# Patient Record
Sex: Male | Born: 1985 | Race: White | Hispanic: No | Marital: Married | Smoking: Never smoker
Health system: Southern US, Community
[De-identification: ages and names within clinical notes are randomized; demographics above are authoritative.]

## PROBLEM LIST (undated history)

## (undated) HISTORY — PX: FOOT FUSION: SHX956

---
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
## 2006-02-19 ENCOUNTER — Emergency Department (HOSPITAL_COMMUNITY): Admission: EM | Admit: 2006-02-19 | Discharge: 2006-02-19 | Payer: Self-pay | Admitting: Emergency Medicine

## 2006-04-23 ENCOUNTER — Emergency Department (HOSPITAL_COMMUNITY): Admission: EM | Admit: 2006-04-23 | Discharge: 2006-04-23 | Payer: Self-pay | Admitting: Emergency Medicine

## 2007-03-28 ENCOUNTER — Emergency Department (HOSPITAL_COMMUNITY): Admission: EM | Admit: 2007-03-28 | Discharge: 2007-03-28 | Payer: Self-pay | Admitting: Emergency Medicine

## 2007-04-17 ENCOUNTER — Emergency Department (HOSPITAL_COMMUNITY): Admission: EM | Admit: 2007-04-17 | Discharge: 2007-04-17 | Payer: Self-pay | Admitting: Emergency Medicine

## 2007-04-25 ENCOUNTER — Other Ambulatory Visit: Payer: Self-pay

## 2007-04-25 ENCOUNTER — Inpatient Hospital Stay (HOSPITAL_COMMUNITY): Admission: AD | Admit: 2007-04-25 | Discharge: 2007-04-27 | Payer: Self-pay | Admitting: Psychiatry

## 2007-04-25 ENCOUNTER — Ambulatory Visit: Payer: Self-pay | Admitting: Psychiatry

## 2007-09-24 ENCOUNTER — Emergency Department (HOSPITAL_COMMUNITY): Admission: EM | Admit: 2007-09-24 | Discharge: 2007-09-24 | Payer: Self-pay | Admitting: Emergency Medicine

## 2010-07-04 ENCOUNTER — Emergency Department (HOSPITAL_COMMUNITY)
Admission: EM | Admit: 2010-07-04 | Discharge: 2010-07-04 | Disposition: A | Discharge status: Home / Self Care | Payer: Self-pay | Attending: Emergency Medicine | Admitting: Emergency Medicine

## 2010-07-04 DIAGNOSIS — F3289 Other specified depressive episodes: Secondary | ICD-10-CM | POA: Insufficient documentation

## 2010-07-04 DIAGNOSIS — F329 Major depressive disorder, single episode, unspecified: Secondary | ICD-10-CM | POA: Insufficient documentation

## 2010-07-04 DIAGNOSIS — W268XXA Contact with other sharp object(s), not elsewhere classified, initial encounter: Secondary | ICD-10-CM | POA: Insufficient documentation

## 2010-07-04 DIAGNOSIS — G809 Cerebral palsy, unspecified: Secondary | ICD-10-CM | POA: Insufficient documentation

## 2010-07-04 DIAGNOSIS — F909 Attention-deficit hyperactivity disorder, unspecified type: Secondary | ICD-10-CM | POA: Insufficient documentation

## 2010-07-04 DIAGNOSIS — S61209A Unspecified open wound of unspecified finger without damage to nail, initial encounter: Secondary | ICD-10-CM | POA: Insufficient documentation

## 2010-07-04 DIAGNOSIS — Y92009 Unspecified place in unspecified non-institutional (private) residence as the place of occurrence of the external cause: Secondary | ICD-10-CM | POA: Insufficient documentation

## 2010-07-31 NOTE — H&P (Signed)
Peter Brewer, Peter Brewer                 ACCOUNT NO.:  [id number]   MEDICAL RECORD NO.:  [id number]          PATIENT TYPE:  IPS   LOCATION:  0507                          FACILITY:  [facility]   PHYSICIAN:  Geoffery Lyons, M.D.      DATE OF BIRTH:  03-26-1985   DATE OF ADMISSION:  04/26/2007  DATE OF DISCHARGE:                       PSYCHIATRIC ADMISSION ASSESSMENT   HISTORY:  This is a 25 year old single white male.  He is involuntarily  committed to the services of Dr. Madie Reno.  His father took out petition  papers on his son after his son's girlfriend called and reported  suicidal ideation.  The patient recently returned to this area.  He had  been in residential rehab in [place] up until approximately 3 weeks  ago.  He has relapsed.  He is suspected to be abusing Xanax.  He  apparently got into an argument with his mother with whom he was living.  She evicted him.  He started drinking.  Went to his girlfriend's place  of employment and said there was no reason for him to go on living, and  that she need not come to his funeral.  The girlfriend contacted the  patient's father who petitioned on the patient due to increasing  impulsivity and argumentativeness recently with family members.  The  patient's UA was positive for cocaine and benzodiazepines.  He is  prescribed Xanax 2 mg t.i.d. by Ellis Savage.  This was last prescribed  April 13, 2006 and [facility]   PAST PSYCHIATRIC HISTORY:  He has been with [place] once before, this was  back in September 2007.  At that time, he was having increased stress,  he was drinking beer.  There were no blackouts or seizures noted.  He  had been found passed out and was admitted at that time.   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  He needs to earn money.  He was living  with his mother, however, he was  evicted and he moved into a hotel.  He is separated from his girlfriend  and they did give up their infant son for adoption.  When he was last  here, they were pregnant.   FAMILY HISTORY:  Negative as far as we know.   ALCOHOL/DRUG HISTORY:  The patient's urine was positive for cocaine.  He  does acknowledge having done a little bump of cocaine.  He was just  recently in a treatment center in [place].  He was also positive for  benzodiazepines, but he is prescribed.  It is unclear whether he is in  fact abusing at this time are not.   PRIMARY CARE Gianmarco Roye:  He does not have one.  He is seeing being seen  at [facility] by Ellis Savage.   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  He was medically cleared in the ED at [facility].  As already  stated, his UA was positive for cocaine and benzodiazepines.  He did not  have any other remarkable physical findings.   PHYSICAL EXAMINATION:  VITAL SIGNS on admission to our unit showed that  his height is 68.75, weight 144, temperature is 97.4, blood pressure was  127/79, pulses 75-79 and respirations are 20.  MENTAL STATUS:  Today, he is alert.  He is appropriately groomed,  dressed and nourished.  His speech is a little hyperverbal.  His mood is  irritable and anxious.  His affect is congruent.  His thought processes  are not completely clear, rational.  They are goal oriented to take get  his Xanax restarted.  Judgment and insight are poor. Concentration and  memory are fair.  Intelligence is average-low average.  He denies being  suicidal or homicidal.  He denies auditory or visual hallucinations.   AXIS I:  Mood disorder, not otherwise specified, cocaine abuse,  potential Xanax abuse.  AXIS II:  Deferred.  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  AXIS IV:  Now homeless.  AXIS V:  30.   PLAN:  Admit for stabilization and safety.  We  will adjust his meds.  Toward that end, he was put on the low-dose Librium protocol.  [facility] [place]  He was prescribed Xanax 2  mg p.o. t.i.d.; tramadol 50 mg p.o. daily and Luvox CR prescription was  put on file, so it is unclear whether he in fact ever took this.  This  had been restarted prior to him being evaluated today and will remain  with those medications.  He will need help with placement once  discharged and estimated length of stay is 4-5 days.      Mickie Leonarda Salon, P.A.-C.      Geoffery Lyons, M.D.  Electronically Signed    MD/MEDQ  D:  04/26/2007  T:  04/27/2007  Job:  [id number]

## 2010-08-03 NOTE — Discharge Summary (Signed)
Peter Brewer, Peter Brewer                 ACCOUNT NO.:  [id number]   MEDICAL RECORD NO.:  [id number]          PATIENT TYPE:  IPS   LOCATION:  0405                          FACILITY:  [facility]   PHYSICIAN:  Peter Brewer, M.D.      DATE OF BIRTH:  02-08-1986   DATE OF ADMISSION:  12/02/2005  DATE OF DISCHARGE:  12/03/2005                                 DISCHARGE SUMMARY   CHIEF COMPLAINT AND PRESENT ILLNESS:  This was the first admission to [facility] for this 25 year old single white male voluntarily  admitted.  History of increased stress, needing a break, drinking beer,  increase to six-pack, last drink December 03, 2005.  No blackouts.  No  seizures.  Occasional use of marijuana.  Found passed out by his father.  Denied suicidal or homicidal ideation.  Currently in pharmacy school.  Baby  on the way.   PAST PSYCHIATRIC HISTORY:  First time at [facility].  Endorsed social  anxiety.  No current treatment.   ALCOHOL/DRUG HISTORY:  Increased use of alcohol.   MEDICAL PROBLEMS:  Cerebral palsy.   MEDICATIONS:  Xanax 3 mg XR.   PHYSICAL EXAMINATION:  Performed and failed to show any acute findings.   LABORATORY DATA:  Drug screen positive for benzodiazepines and marijuana.  Sodium 140, potassium 3.9, glucose 83.   MENTAL STATUS EXAM:  Fully alert, cooperative male.  Good eye contact.  Casually dressed.  Speech clear, normal rate, tempo and production.  Mood  anxiety.  Affect anxiety.  Thought processes logical, coherent and relevant.  No active delusions.  No active suicidal or homicidal ideation.  No  hallucinations.  Cognition was well-preserved.   ADMISSION DIAGNOSES:  AXIS I:  Alcohol and marijuana abuse.  AXIS II:  No diagnosis.  AXIS III:  Cerebral palsy.  AXIS IV:  Moderate.  AXIS V:  GAF upon admission 45; highest GAF in the last year 65.   HOSPITAL COURSE:  He was admitted.  He was started in individual and group  psychotherapy.  He did admit to the  increased use of alcohol, up to six  beers per day.  He also used Xanax XR 3 mg twice a day as prescribed by his  primary care physician.  Also admits to occasional use of marijuana.  He is  diagnosed ADHD and was earlier on Ritalin.  Endorsing anxiety, worse in  public settings.  Claimed he needed Xanax to function.  Has used SSRIs but  claims increased anxiety and restlessness when he used them.  He was not  committed to abstaining from Xanax but he claimed he was willing to quit  drinking.  Aware of the interaction between Xanax and alcohol.  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  Endorsed he wanted to get his life back together.  He needed to leave  the hospital as he had to be back in school.  Claimed he was making A's.  But staying in the hospital was going to  make things worse for him.  As he  was in full contact with reality, there was no evidence of suicidal or  homicidal ideation, no evidence of imminent withdrawal, we went ahead and  discharged to outpatient follow-up.   DISCHARGE DIAGNOSES:  AXIS I:  Alcohol and marijuana abuse.  Anxiety  disorder not otherwise specified.  AXIS II:  No diagnosis.  AXIS III:  Cerebral palsy.  AXIS IV:  Moderate.  AXIS V:  GAF upon discharge 50-55.   DISCHARGE MEDICATIONS:  Discharged on no medications.  He was probably going  to pursue the Xanax that he had refill for.  He was encouraged to discuss  with his physician how appropriate it was for ongoing use of Xanax.   FOLLOWUP:  Ardelle Lesches at [facility] in [place].      Peter Brewer, M.D.  Electronically Signed     IL/MEDQ  D:  01/16/2006  T:  01/17/2006  Job:  [id number]

## 2010-08-03 NOTE — Discharge Summary (Signed)
Peter Brewer, Peter Brewer                 ACCOUNT NO.:  [id number]   MEDICAL RECORD NO.:  [id number]          PATIENT TYPE:  IPS   LOCATION:  0507                          FACILITY:  [facility]   PHYSICIAN:  Geoffery Lyons, M.D.      DATE OF BIRTH:  1985/12/08   DATE OF ADMISSION:  04/25/2007  DATE OF DISCHARGE:  04/27/2007                               DISCHARGE SUMMARY   CHIEF COMPLAINT AND HISTORY OF PRESENT ILLNESS:  This was the second  admission to [facility]; the first one had been in  September 2007 for this 25 year old single white male involuntarily  committed.  Father took a petition after his son's girlfriend called and  reported suicidal thoughts.  The patient recently return to this area,  had been in residential rehab in [place] up to 3 weeks ago. He  relapsed. Suspected to be abusing Xanax. Apparently got into an argument  with his mother with whom he was living.  She evicted him. He started  drinking, went to his girlfriend place of employment and told her that  there was no reason for him to go on living, that she did not need to  come to his funeral.   PAST PSYCHIATRIC HISTORY:  He had been admitted February 2007. At that  time he was having increased stress, drinking beer. He was also using  marijuana.   ALCOHOL AND DRUG HISTORY:  As already stated, urine drug screen was  positive for cocaine and benzodiazepines. He claimed having done a  bump of cocaine recently and in treatment at [facility]. Positive for  benzodiazepine that he is prescribed.   PAST MEDICAL HISTORY:  He was born with cerebral palsy.  He had a stroke  at birth.   PHYSICAL EXAMINATION:  Failed to show any acute findings.   LABORATORY WORKUP:  Not available in the chart.   MENTAL STATUS EXAM:  Exam revealed alert, cooperative male appropriately  groomed and dressed, somewhat anxious, irritable.  Affect was congruent.  Wanted to get his Xanax restarted.  Clearer insight but denied any  active suicidal or homicidal ideations.  No evidence of delusions.  No  hallucinations.  Cognition well preserved.   AXIS I:  1. Cocaine abuse, rule out benzodiazepine abuse.  2 . Mood disorder not otherwise specified.  AXIS II:  No diagnosis.  AXIS III.  Cerebral palsy.  AXIS IV:  Moderate.  AXIS V:  Upon admission 30-35, GAF in the last year 70.   COURSE IN THE HOSPITAL:  Was admitted.  We started detoxification with  Librium. As already stated, a 25 year old male. After arguing with his  mother who evicted him. he was petitioned. Family session on February 8  with his father. His perception was that his father was being mean. He  said he was never suicidal. His father was hoping that being in the unit  was going to help him decide to go to a 30-day program. He did not want  to that, wanted to be released. He was due to start new job Monday at 11  o'clock. The  father felt that he could be released and that he did not  seem to be ready to face his problem.  He continued to minimize his  drinking or drug use, claimed that he drank three times in the past 3  months.  February 9, he endorsed that he was good to go.  Endorsed no  active suicidal or homicidal ideas, said that the girlfriend told things  that were not accurate, said that he would never hurt himself. Claimed  that, if anything, things are better for him right now as compared to  when he was admitted in 2007.  He was looking forward to a new job. e  was connected with outpatient services, was looking forward to his life  at this particular time.  Endorsed he was committed to abstaining.   DISCHARGE DIAGNOSES:  AXIS I:  1 . Cocaine and benzodiazepine abuse.  2 . Anxiety disorder not otherwise specified.  AXIS II:  No diagnosis.  AXIS III:  Cerebral palsy.  AXIS IV:  Moderate.  AXIS V:  Upon discharge 50.   DISCHARGE MEDICATIONS:  1. Demerol 50 mg every 6 hours as needed,  2. Luvox 100 mg per day.  3. Librium 25 one  after lunch and at bedtime, then one on February 9,      then one twice a day February 10 and once in the morning February      11, then discontinue.  4. Asked to stop the Xanax.   FOLLOWUP PLAN:  Psychiatric with [facility].      Geoffery Lyons, M.D.  Electronically Signed     IL/MEDQ  D:  05/22/2007  T:  05/24/2007  Job:  [id number]

## 2010-12-07 LAB — DIFFERENTIAL
Eosinophils Relative: 2
Lymphocytes Relative: 44
Lymphs Abs: 3.5
Monocytes Absolute: 1.2 — ABNORMAL HIGH
Monocytes Relative: 15 — ABNORMAL HIGH
Neutro Abs: 3.1
Neutrophils Relative %: 38 — ABNORMAL LOW

## 2010-12-07 LAB — BASIC METABOLIC PANEL
BUN: 10
CO2: 30
Chloride: 101
Creatinine, Ser: 0.96
GFR calc non Af Amer: >60
Glucose, Bld: 84
Sodium: 139

## 2010-12-07 LAB — RAPID URINE DRUG SCREEN, HOSP PERFORMED
Amphetamines: NOT DETECTED
Barbiturates: NOT DETECTED
Tetrahydrocannabinol: NOT DETECTED

## 2010-12-07 LAB — HEPATIC FUNCTION PANEL: Bilirubin, Direct: 0.2

## 2010-12-07 LAB — CBC
Hemoglobin: 16.5
MCHC: 34.2
RBC: 5.44
WBC: 8.1

## 2010-12-07 LAB — ETHANOL: Alcohol, Ethyl (B): <5

## 2021-02-24 ENCOUNTER — Emergency Department (HOSPITAL_COMMUNITY)
Admission: EM | Admit: 2021-02-24 | Discharge: 2021-02-24 | Discharge status: Against Medical Advice | Payer: Self-pay | Source: Home / Self Care

## 2021-04-23 ENCOUNTER — Encounter (HOSPITAL_BASED_OUTPATIENT_CLINIC_OR_DEPARTMENT_OTHER): Payer: Self-pay

## 2021-04-23 ENCOUNTER — Other Ambulatory Visit: Payer: Self-pay

## 2021-04-23 ENCOUNTER — Emergency Department (HOSPITAL_BASED_OUTPATIENT_CLINIC_OR_DEPARTMENT_OTHER)
Admission: EM | Admit: 2021-04-23 | Discharge: 2021-04-23 | Disposition: A | Discharge status: Home / Self Care | Payer: Self-pay | Attending: Emergency Medicine | Admitting: Emergency Medicine

## 2021-04-23 ENCOUNTER — Emergency Department (HOSPITAL_BASED_OUTPATIENT_CLINIC_OR_DEPARTMENT_OTHER): Payer: Self-pay

## 2021-04-23 DIAGNOSIS — W182XXA Fall in (into) shower or empty bathtub, initial encounter: Secondary | ICD-10-CM | POA: Insufficient documentation

## 2021-04-23 DIAGNOSIS — R55 Syncope and collapse: Secondary | ICD-10-CM | POA: Insufficient documentation

## 2021-04-23 DIAGNOSIS — S0003XA Contusion of scalp, initial encounter: Secondary | ICD-10-CM | POA: Insufficient documentation

## 2021-04-23 LAB — RAPID URINE DRUG SCREEN, HOSP PERFORMED
Amphetamines: NOT DETECTED
Barbiturates: NOT DETECTED
Benzodiazepines: NOT DETECTED
Cocaine: NOT DETECTED
Opiates: NOT DETECTED
Tetrahydrocannabinol: POSITIVE — AB

## 2021-04-23 LAB — CBC
HCT: 48.5 % (ref 39.0–52.0)
Hemoglobin: 16.2 g/dL (ref 13.0–17.0)
MCH: 29.8 pg (ref 26.0–34.0)
MCHC: 33.4 g/dL (ref 30.0–36.0)
MCV: 89.2 fL (ref 80.0–100.0)
Platelets: 268 10*3/uL (ref 150–400)
RBC: 5.44 MIL/uL (ref 4.22–5.81)
RDW: 14.7 % (ref 11.5–15.5)
WBC: 9.8 10*3/uL (ref 4.0–10.5)
nRBC: 0 % (ref 0.0–0.2)

## 2021-04-23 LAB — COMPREHENSIVE METABOLIC PANEL
ALT: 17 U/L (ref 0–44)
AST: 18 U/L (ref 15–41)
Albumin: 4.5 g/dL (ref 3.5–5.0)
Alkaline Phosphatase: 48 U/L (ref 38–126)
Anion gap: 9 (ref 5–15)
BUN: 14 mg/dL (ref 6–20)
CO2: 25 mmol/L (ref 22–32)
Calcium: 9.2 mg/dL (ref 8.9–10.3)
Chloride: 102 mmol/L (ref 98–111)
Creatinine, Ser: 0.82 mg/dL (ref 0.61–1.24)
GFR, Estimated: >60 mL/min (ref >60)
Glucose, Bld: 141 mg/dL — ABNORMAL HIGH (ref 70–99)
Potassium: 3.9 mmol/L (ref 3.5–5.1)
Sodium: 136 mmol/L (ref 135–145)
Total Bilirubin: 0.6 mg/dL (ref 0.3–1.2)
Total Protein: 7 g/dL (ref 6.5–8.1)

## 2021-04-23 LAB — ETHANOL: Alcohol, Ethyl (B): <10 mg/dL (ref <10)

## 2021-04-23 LAB — TROPONIN I (HIGH SENSITIVITY)
Troponin I (High Sensitivity): 3 ng/L (ref <18)
Troponin I (High Sensitivity): 3 ng/L (ref <18)

## 2021-04-23 MED ORDER — SODIUM CHLORIDE 0.9 % IV BOLUS
1000.0000 mL | Freq: Once | INTRAVENOUS | Status: AC
  Administered 2021-04-23: 1000 mL via INTRAVENOUS

## 2021-04-23 NOTE — ED Notes (Signed)
Patient transported to CT 

## 2021-04-23 NOTE — ED Notes (Signed)
Patient verbalizes understanding of discharge instructions. Opportunity for questioning and answers were provided. Patient discharged from ED.  °

## 2021-04-23 NOTE — ED Provider Notes (Signed)
[facility]   CSN: [id number] Arrival date & time: 04/23/21  [id number]     History  Chief Complaint  Patient presents with   Loss of Consciousness    Peter HartshornJacob Brewer is a 36 y.o. male.  HPI 36 year old male presents today after syncopal episode.  He states that he was up getting ready to go to the gym in his usual state of health.  He took his weekly testosterone shot.  He was urinating when he became lightheaded and he woke up backwards in the bathtub after striking the side of his head.  He thinks the timeframe was approximately 5 minutes.  His girlfriend had already gone to work and there was no one else awake in the home.  He has a contused area that he is noted on the right side of his head.  He has not had any intraoral trauma.  He did not note any chest pain, shortness of breath, abdominal pain, nausea, or vomiting.  He felt sweaty just prior to the episode.  He denies any prior similar episodes.  He states that he is taking the testosterone from an online source due to cost.  He is does not feel that there is any change in this from prior.  He has a distant history of seizure related to alcohol withdrawal but states he has not been drinking for quite a long time.     Home Medications Prior to Admission medications   Not on File      Allergies    Patient has no allergy information on record.    Review of Systems   Review of Systems  All other systems reviewed and are negative.  Physical Exam Updated Vital Signs BP 116/66    Pulse (!) 52    Temp 98.9 F (37.2 C) (Oral)    Resp 15    Ht 1.753 m (5\' 9" )    Wt 70.3 kg    SpO2 100%    BMI 22.89 kg/m  Physical Exam Vitals and nursing note reviewed.  Constitutional:      Appearance: Normal appearance.  HENT:     Head: Normocephalic and atraumatic.     Right Ear: External ear normal.     Left Ear: External ear normal.     Nose: Nose normal.     Mouth/Throat:     Mouth: Mucous membranes  are moist.     Pharynx: Oropharynx is clear.  Eyes:     Pupils: Pupils are equal, round, and reactive to light.  Cardiovascular:     Rate and Rhythm: Normal rate.  Pulmonary:     Effort: Pulmonary effort is normal.  Abdominal:     General: Abdomen is flat. Bowel sounds are normal.     Palpations: Abdomen is soft.  Musculoskeletal:        General: Normal range of motion.     Cervical back: Normal range of motion.  Skin:    General: Skin is warm and dry.  Neurological:     General: No focal deficit present.     Mental Status: He is alert and oriented to person, place, and time.     Cranial Nerves: No cranial nerve deficit.     Sensory: No sensory deficit.     Motor: No weakness.     Coordination: Coordination normal.  Psychiatric:        Mood and Affect: Mood normal.        Behavior: Behavior normal.  ED Results / Procedures / Treatments   Labs (all labs ordered are listed, but only abnormal results are displayed) Labs Reviewed  COMPREHENSIVE METABOLIC PANEL - Abnormal; Notable for the following components:      Result Value   Glucose, Bld 141 (*)    All other components within normal limits  RAPID URINE DRUG SCREEN, HOSP PERFORMED - Abnormal; Notable for the following components:   Tetrahydrocannabinol POSITIVE (*)    All other components within normal limits  CBC  ETHANOL  TROPONIN I (HIGH SENSITIVITY)  TROPONIN I (HIGH SENSITIVITY)    EKG EKG Interpretation  Date/Time:  Monday April 23 2021 07:29:42 EST Ventricular Rate:  67 PR Interval:  128 QRS Duration: 94 QT Interval:  364 QTC Calculation: 385 R Axis:   84 Text Interpretation: Sinus rhythm Normal ECG No significant change since last tracing Confirmed by Peter Brewer [phone]) on 04/23/2021 8:34:40 AM  Radiology CT Head Wo Contrast  Result Date: 04/23/2021 CLINICAL DATA:  36 year old male with history of head trauma after falling into a bathtub. EXAM: CT HEAD WITHOUT CONTRAST CT CERVICAL SPINE WITHOUT  CONTRAST TECHNIQUE: Multidetector CT imaging of the head and cervical spine was performed following the standard protocol without intravenous contrast. Multiplanar CT image reconstructions of the cervical spine were also generated. RADIATION DOSE REDUCTION: This exam was performed according to the departmental dose-optimization program which includes automated exposure control, adjustment of the mA and/or kV according to patient size and/or use of iterative reconstruction technique. COMPARISON:  Head CT 07/29/2003. FINDINGS: CT HEAD FINDINGS Brain: There is a well-defined area of low attenuation in the inferior aspect of the left frontal lobe anteriorly, the appearance of which is most compatible with encephalomalacia, likely related to remote contusion. Mild contour abnormality of the left lateral ventricle, stable compared to prior study from 2005, likely congenital (potentially related to mild periventricular leukomalacia). No evidence of acute infarction, hemorrhage, hydrocephalus, extra-axial collection or mass lesion/mass effect. Vascular: No hyperdense vessel or unexpected calcification. Skull: Normal. Negative for fracture or focal lesion. Sinuses/Orbits: No acute finding. Other: None. CT CERVICAL SPINE FINDINGS Alignment: Normal. Skull base and vertebrae: No acute fracture. No primary bone lesion or focal pathologic process. Soft tissues and spinal canal: No prevertebral fluid or swelling. No visible canal hematoma. Disc levels: No significant degenerative disc disease or facet arthropathy. Upper chest: Negative. Other: None. IMPRESSION: 1. No evidence of significant acute traumatic injury to the skull, brain or cervical spine. 2. 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 Electronically Signed   By: Peter Brewer M.D.   On: 04/23/2021 08:10   CT Cervical Spine Wo Contrast  Result  Date: 04/23/2021 CLINICAL DATA:  36 year old male with history of head trauma after falling into a bathtub. EXAM: CT HEAD WITHOUT CONTRAST CT CERVICAL SPINE WITHOUT CONTRAST TECHNIQUE: Multidetector CT imaging of the head and cervical spine was performed following the standard protocol without intravenous contrast. Multiplanar CT image reconstructions of the cervical spine were also generated. RADIATION DOSE REDUCTION: This exam was performed according to the departmental dose-optimization program which includes automated exposure control, adjustment of the mA and/or kV according to patient size and/or use of iterative reconstruction technique. COMPARISON:  Head CT 07/29/2003. FINDINGS: CT HEAD FINDINGS Brain: There is a well-defined area of low attenuation in the inferior aspect of the left frontal lobe anteriorly, the appearance of which is most compatible with encephalomalacia, likely  related to remote contusion. Mild contour abnormality of the left lateral ventricle, stable compared to prior study from 2005, likely congenital (potentially related to mild periventricular leukomalacia). No evidence of acute infarction, hemorrhage, hydrocephalus, extra-axial collection or mass lesion/mass effect. Vascular: No hyperdense vessel or unexpected calcification. Skull: Normal. Negative for fracture or focal lesion. Sinuses/Orbits: No acute finding. Other: None. CT CERVICAL SPINE FINDINGS Alignment: Normal. Skull base and vertebrae: No acute fracture. No primary bone lesion or focal pathologic process. Soft tissues and spinal canal: No prevertebral fluid or swelling. No visible canal hematoma. Disc levels: No significant degenerative disc disease or facet arthropathy. Upper chest: Negative. Other: None. IMPRESSION: 1. No evidence of significant acute traumatic injury to the skull, brain or cervical spine. 2. 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 Electronically Signed   By: Peter Brewer M.D.   On: 04/23/2021 08:10    Procedures Procedures    Medications Ordered in ED Medications  sodium chloride 0.9 % bolus 1,000 mL (0 mLs Intravenous Stopped 04/23/21 0930)    ED Course/ Medical Decision Making/ A&P Clinical Course as of 04/23/21 1014  Mon Apr 23, 2021  0844 CBC personally reviewed and interpreted as normal Chemistry personally reviewed and mild hyperglycemia with glucose of 141 interpreted otherwise within normal limits Alcohol reviewed and is within normal limits Urine drug screen is positive only for THC Alcohol is less than 10 First troponin is 3.  Repeat troponin is pending. [DR]  1012 Repeat troponin reviewed and is 3 which is unchanged from prior with 0 delta Peter Brewer [DR]    Clinical Course User Index [DR] Peter Grizzle, MD                           Medical Decision Making 36 yo male with presumed syncopal episode.  No witness but per patient time frame approximately 5 minutes.  Head contusion which required ct head and neck due to loc of unknown duration. No acute changes EKG without acute changes specifically no evidence of stemi- first troponin normal.  Plan delta troponin. OW labs essentiall winthin normal limits Patient did take exogenous testosterone of unknown etiology- cannot rule out contaminant UDS pending   Amount and/or Complexity of Data Reviewed Labs: ordered. Decision-making details documented in ED Course. Radiology: ordered and independent interpretation performed. Decision-making details documented in ED Course. ECG/medicine tests: ordered and independent interpretation performed. Decision-making details documented in ED Course. Discussion of management or test interpretation with external provider(s): Patient has remained on monitor in normal sinus rhythm  Risk Decision regarding hospitalization. Diagnosis or treatment significantly limited by  social determinants of health. Risk Details: Discussed results of labs, imaging, urine drug screen with patient and significant other. He is pending repeat troponin at this time. Discussed abnormality seen on CT which is from old head injury I feel this is most likely vasovagal syncope as patient had just had injection and was urinating.  He did have some prodrome.  Did not see any evidence of seizures. EKG and labs reviewed and patient appears stable for discharge.  No evidence of acute MI or acute arrhythmia which would require further hospitalization. We discussed return precautions and need for close follow-up and patient voices understanding.           Final Clinical Impression(s) / ED Diagnoses Final diagnoses:  Syncope, unspecified syncope type  Contusion of scalp, initial encounter    Rx / DC Orders ED Discharge Orders     None         Peter Grizzle, MD 04/23/21 1014

## 2021-04-23 NOTE — ED Triage Notes (Signed)
States passed out after taking an injection of his testosterone falling into bath tub.  Hit head.  Small bump right side of head.  A & O x 4.  States recently start new anti depression medication. Ambulatory to room without difficulty.

## 2023-06-26 IMAGING — CT CT HEAD W/O CM
4 series · 16 of 47 positions shown, 18 images · non-contrast
Comparison: Head CT 07/29/2003.

CLINICAL DATA: 35-year-old male with history of head trauma after
falling into a bathtub.



[Series 2: head wo · axial · 0.46mm/px · z∈[+996,+1111]mm · 7 of 31 slices shown, 9 images]
[im 4/31  brain]
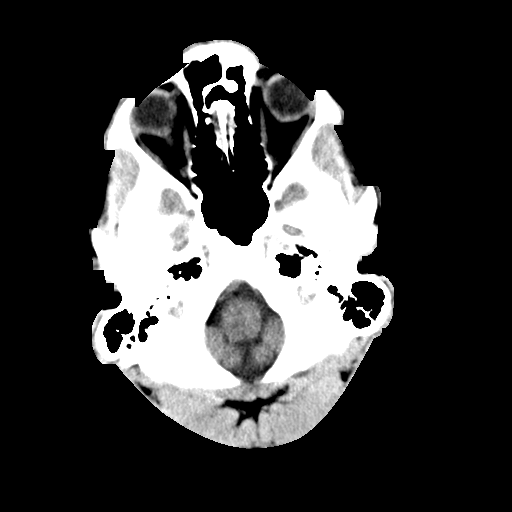
[im 4/31  bone]
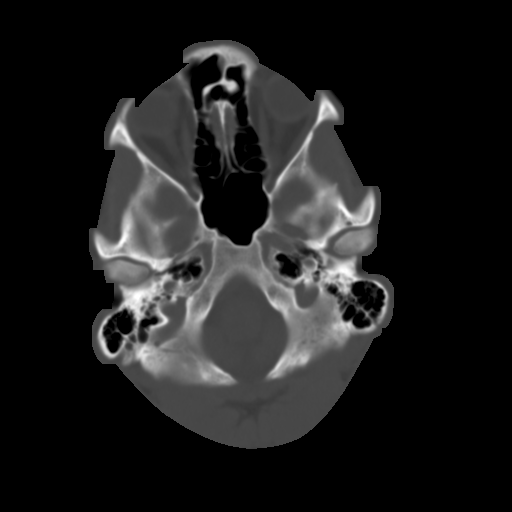
[im 8/31  brain]
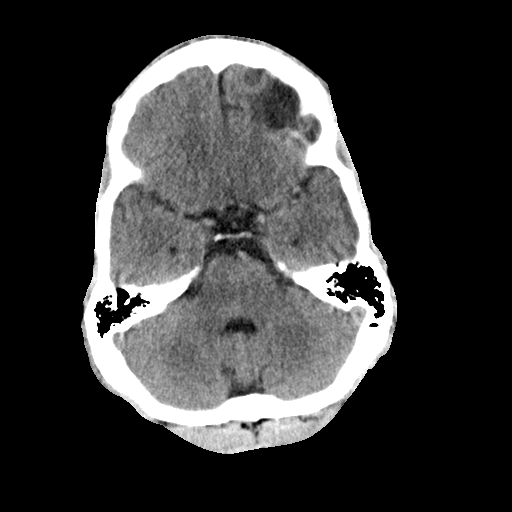
[im 12/31  brain]
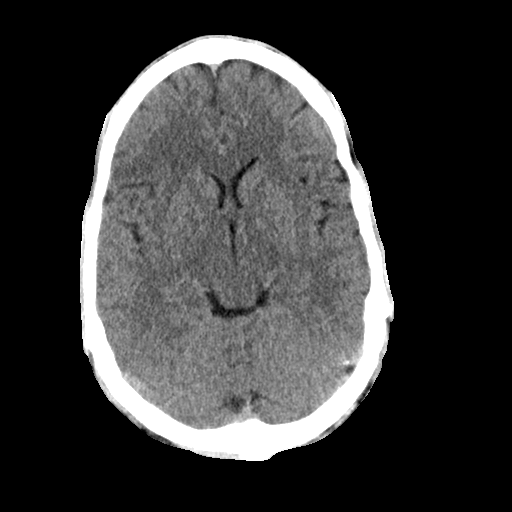
[im 16/31  brain]
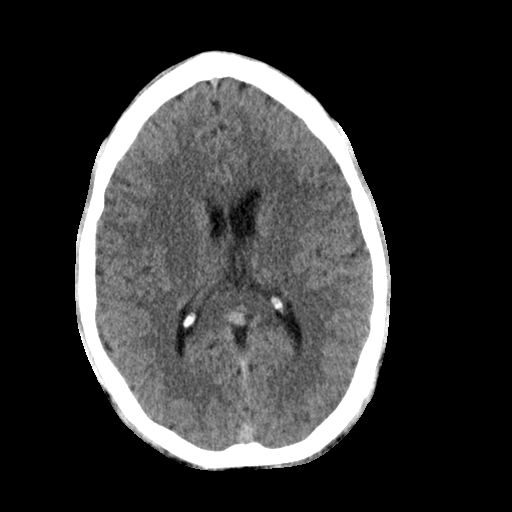
[im 19/31  brain]
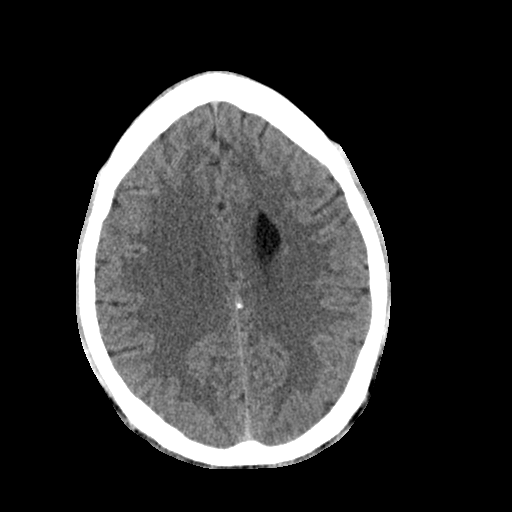
[im 19/31  bone]
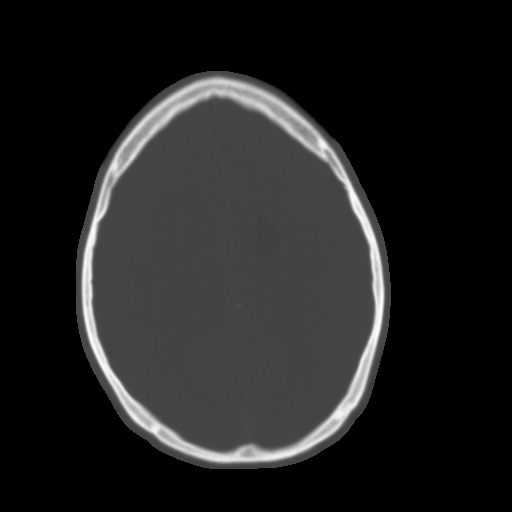
[im 23/31  brain]
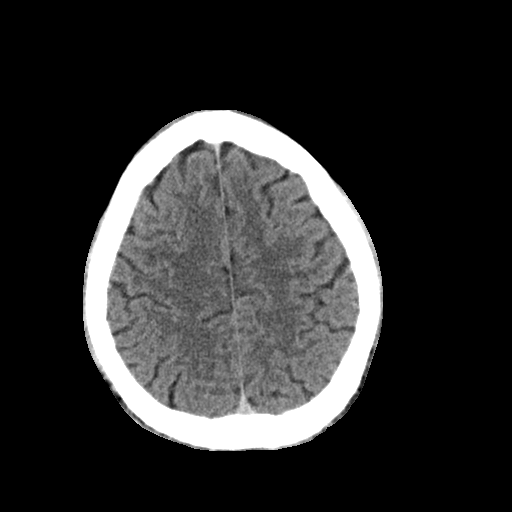
[im 27/31  brain]
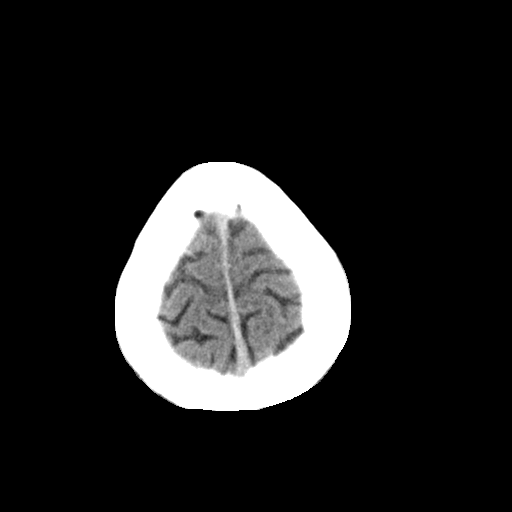

[Series 3: head bone · axial · 0.46mm/px · z∈[+995,+1025]mm · 3 of 76 slices shown]
[im 8/76  bone]
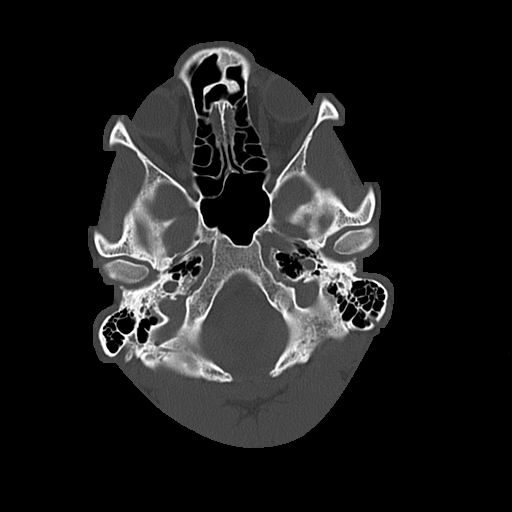
[im 16/76  bone]
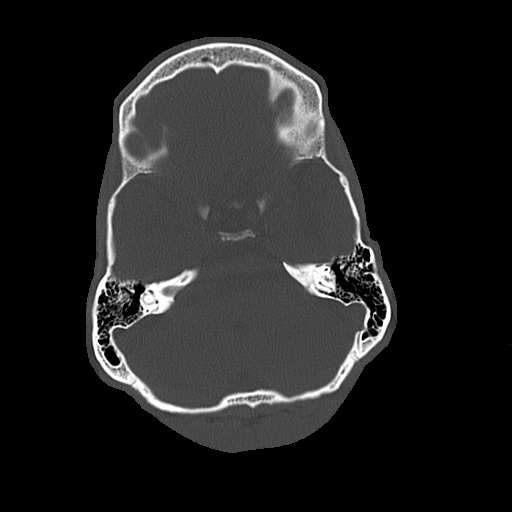
[im 23/76  bone]
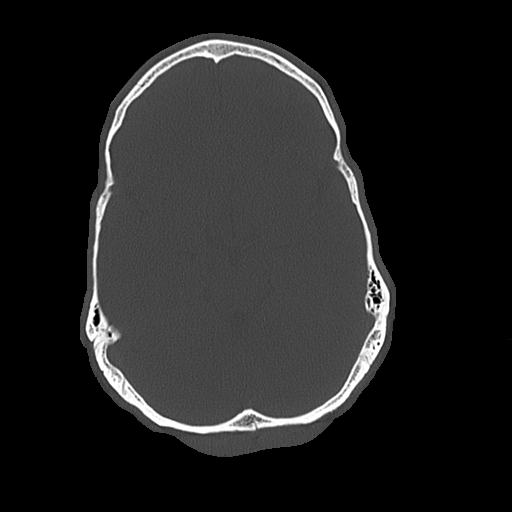

[Series 4: coronal soft · coronal · 0.34mm/px · 3 of 74 slices shown]
[im 25/74  brain]
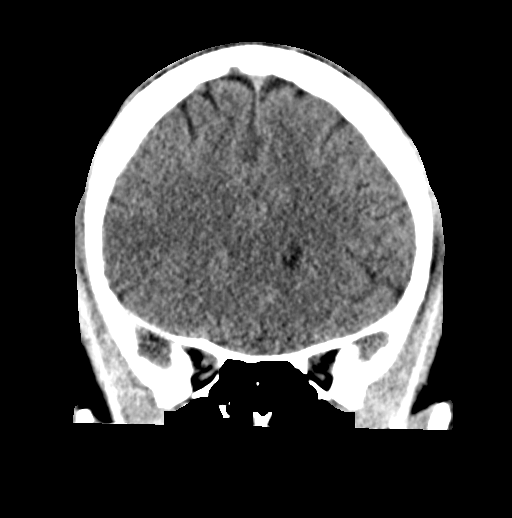
[im 33/74  brain]
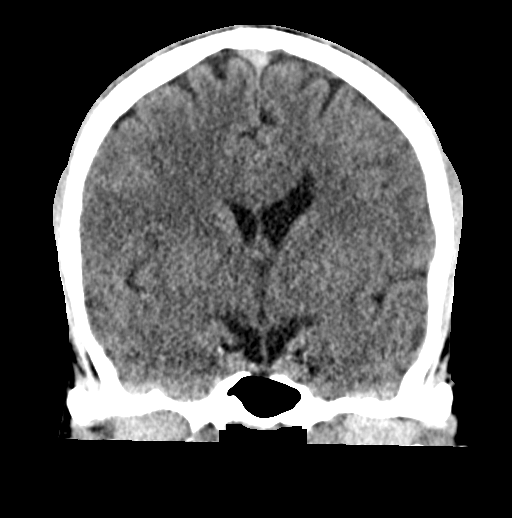
[im 41/74  brain]
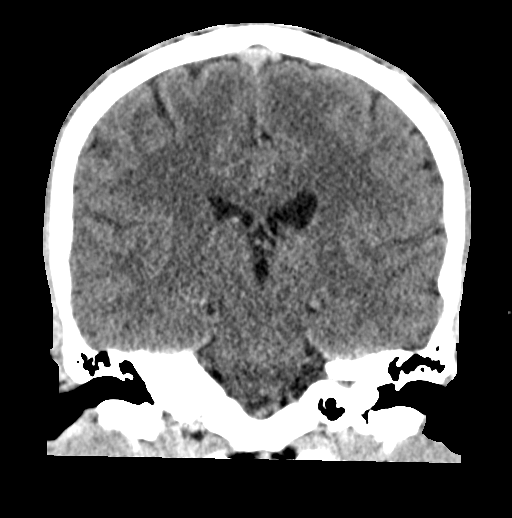

[Series 5: sagittal soft · sagittal · 0.34mm/px · 3 of 58 slices shown]
[im 20/58  brain]
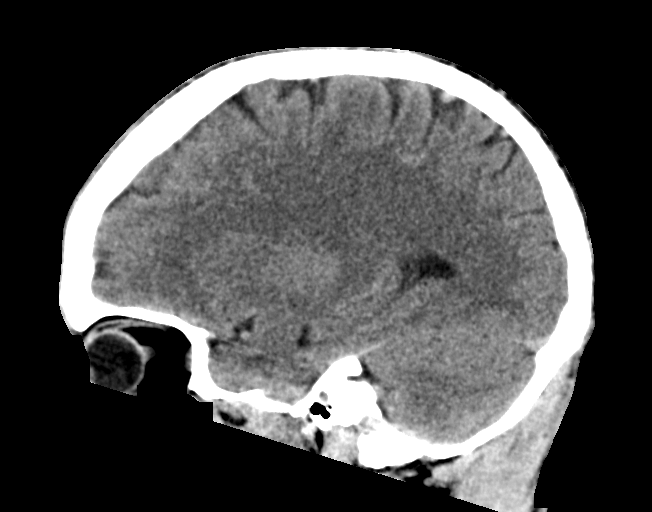
[im 29/58  brain]
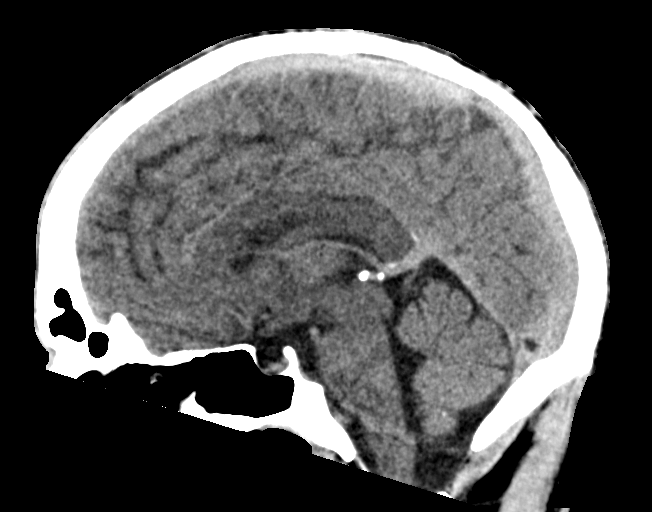
[im 39/58  brain]
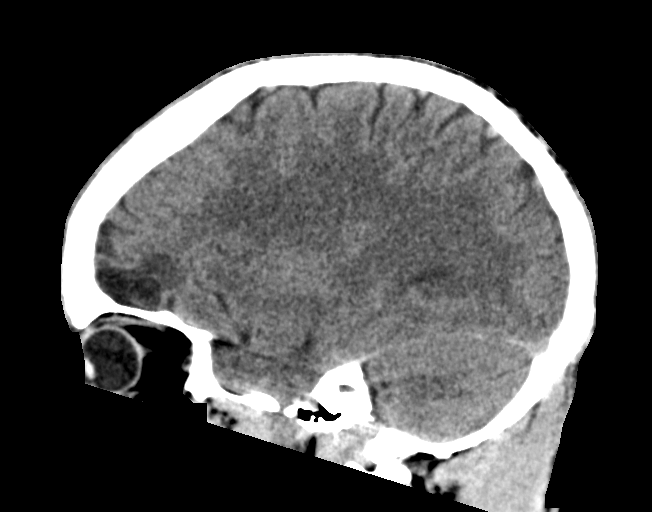

[16 of 47 positions shown; findings below may reference images not displayed]

FINDINGS: CT HEAD FINDINGS

Brain: There is a well-defined area of low attenuation in the
inferior aspect of the left frontal lobe anteriorly, the appearance
of which is most compatible with encephalomalacia, likely related to
remote contusion. Mild contour abnormality of the left lateral
ventricle, stable compared to prior study from 2778, likely
congenital (potentially related to mild periventricular
leukomalacia). No evidence of acute infarction, hemorrhage,
hydrocephalus, extra-axial collection or mass lesion/mass effect.

Vascular: No hyperdense vessel or unexpected calcification.

Skull: Normal. Negative for fracture or focal lesion.

Sinuses/Orbits: No acute finding.

Other: None.

CT CERVICAL SPINE FINDINGS

Alignment: Normal.

Skull base and vertebrae: No acute fracture. No primary bone lesion
or focal pathologic process.

Soft tissues and spinal canal: No prevertebral fluid or swelling. No
visible canal hematoma.

Disc levels: No significant degenerative disc disease or facet
arthropathy.

Upper chest: Negative.

Other: None.
IMPRESSION: 1. No evidence of significant acute traumatic injury to the skull,
brain or cervical spine.
2. There is what appears to be chronic encephalomalacia in the
inferior aspect of the left frontal lobe, presumably related to
remote left frontal lobe contusion.

## 2023-06-26 IMAGING — CT CT CERVICAL SPINE W/O CM
3 series · 13 of 35 positions shown, 16 images · non-contrast
Comparison: Head CT 07/29/2003.

CLINICAL DATA: 35-year-old male with history of head trauma after
falling into a bathtub.



[Series 4: c spine soft · axial · 0.31mm/px · z∈[+848,+958]mm · 5 of 81 slices shown, 7 images]
[im 13/81  soft-tissue]
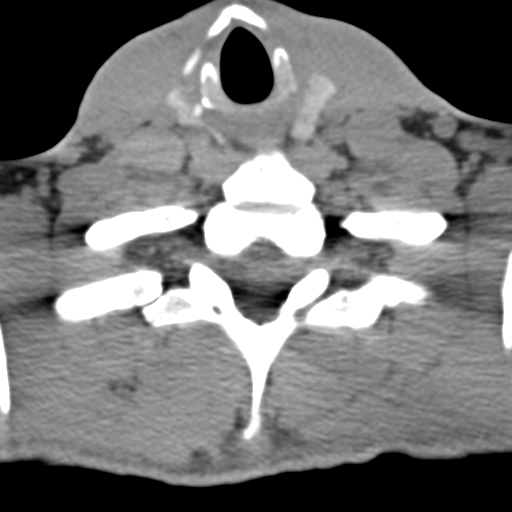
[im 13/81  bone]
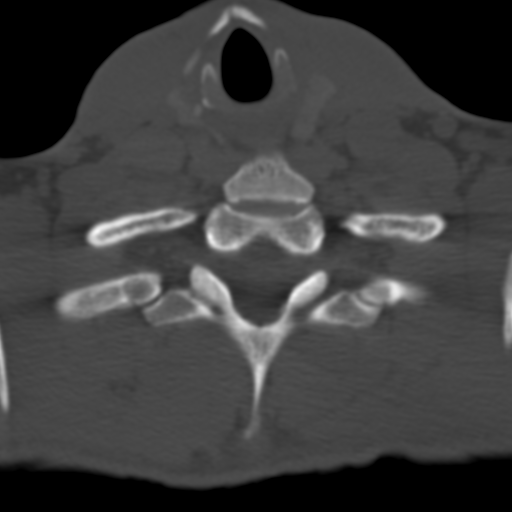
[im 25/81  bone]
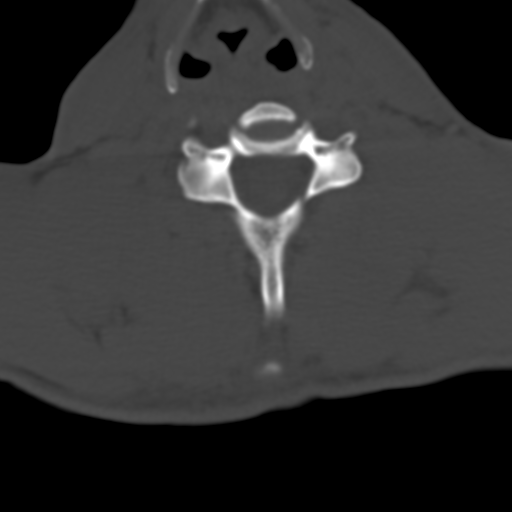
[im 44/81  bone]
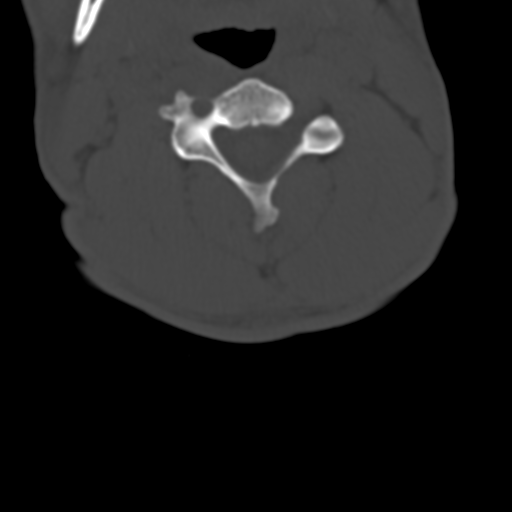
[im 56/81  bone]
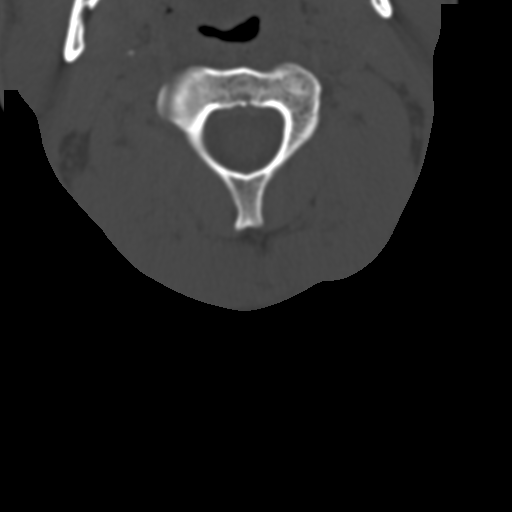
[im 68/81  soft-tissue]
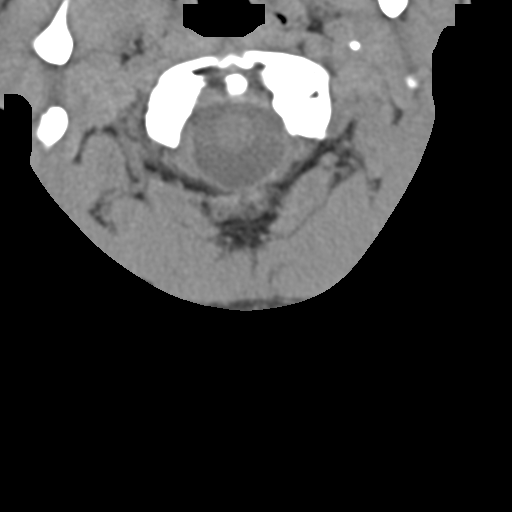
[im 68/81  bone]
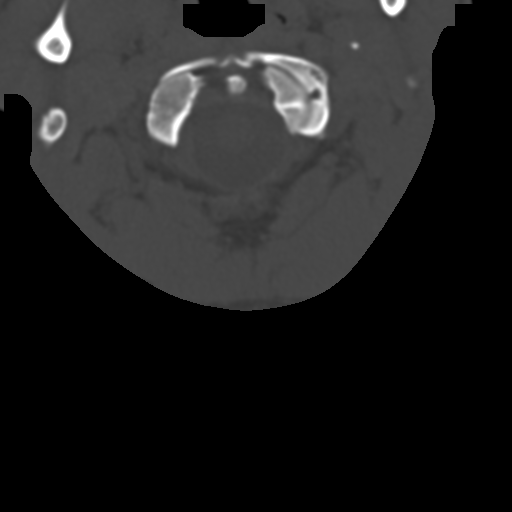

[Series 5: cor bone · coronal · 0.22mm/px · 3 of 63 slices shown]
[im 13/63  bone]
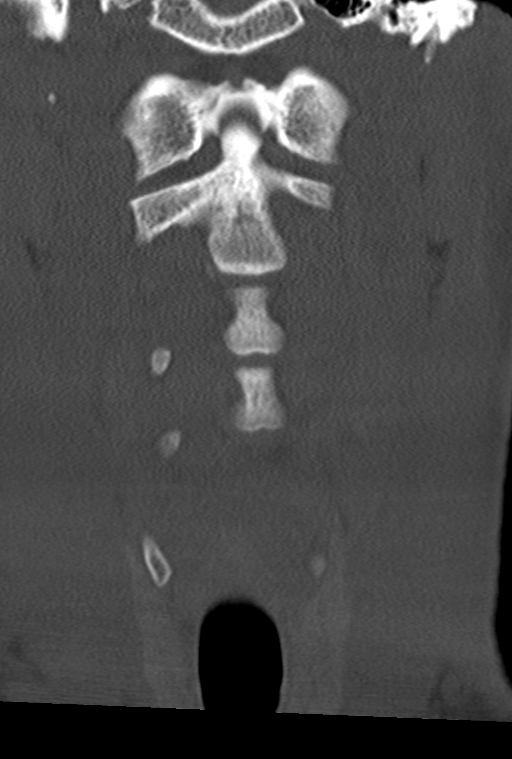
[im 25/63  bone]
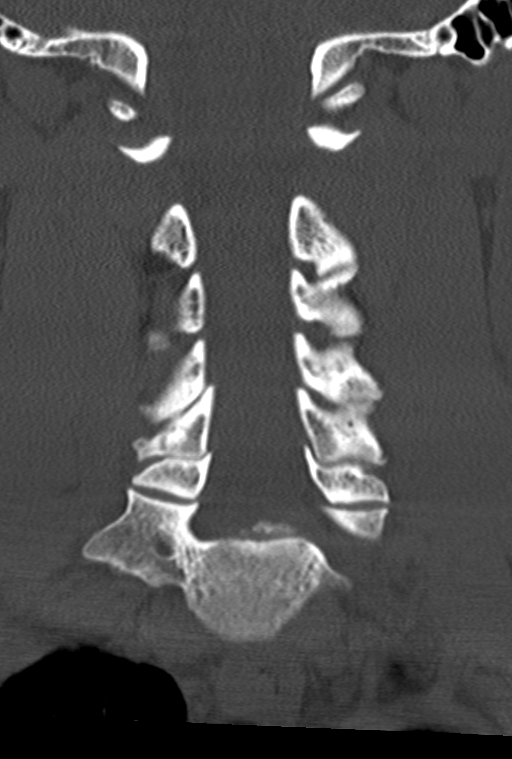
[im 38/63  bone]
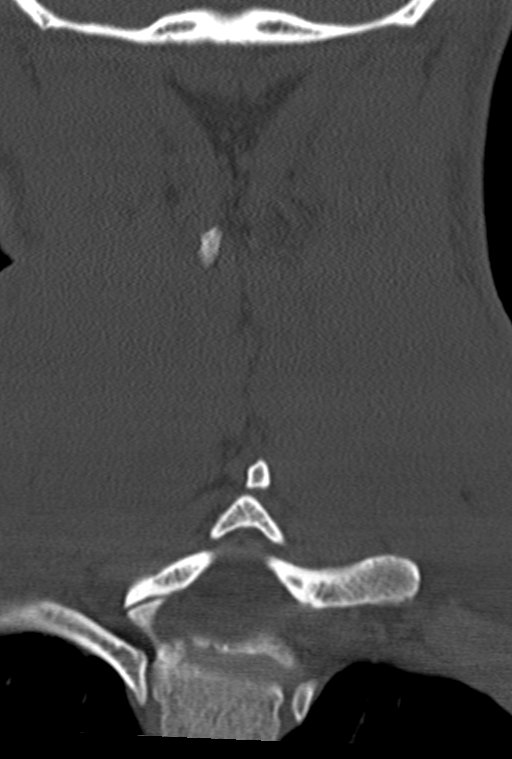

[Series 6: sag bone · sagittal · 0.32mm/px · 5 of 61 slices shown, 6 images]
[im 21/61  bone]
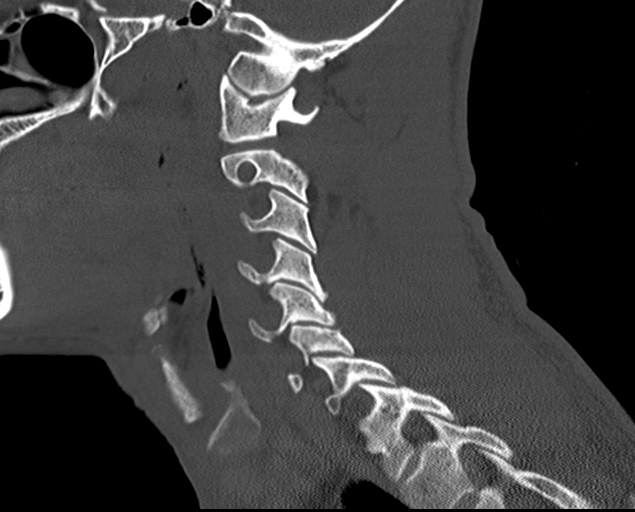
[im 26/61  bone]
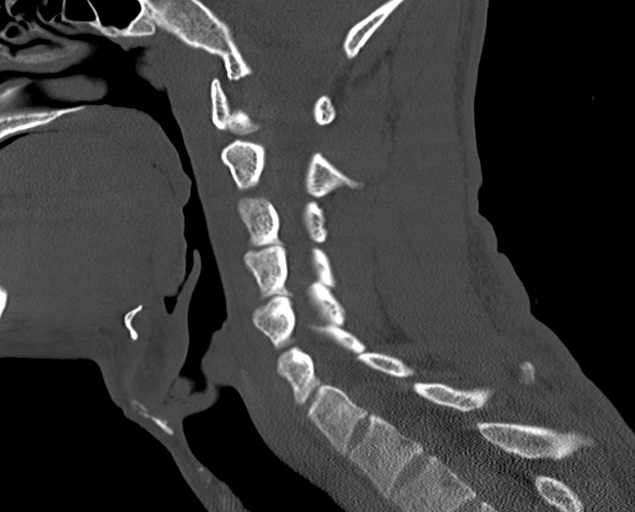
[im 31/61  soft-tissue]
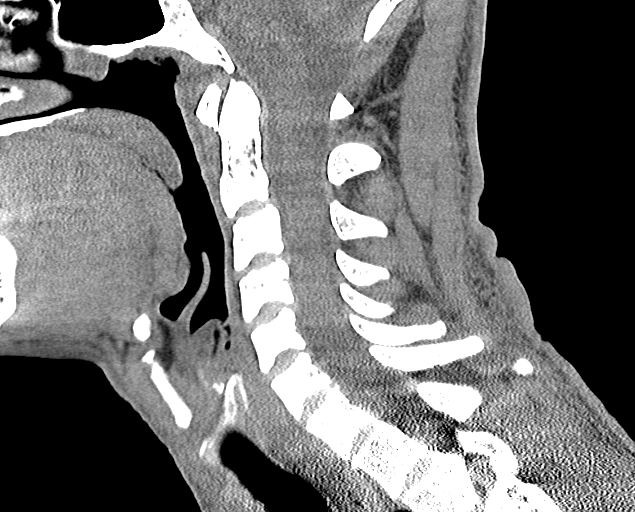
[im 31/61  bone]
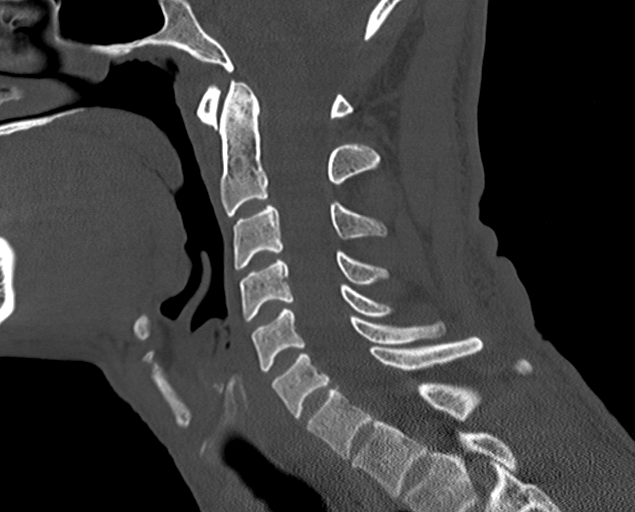
[im 36/61  bone]
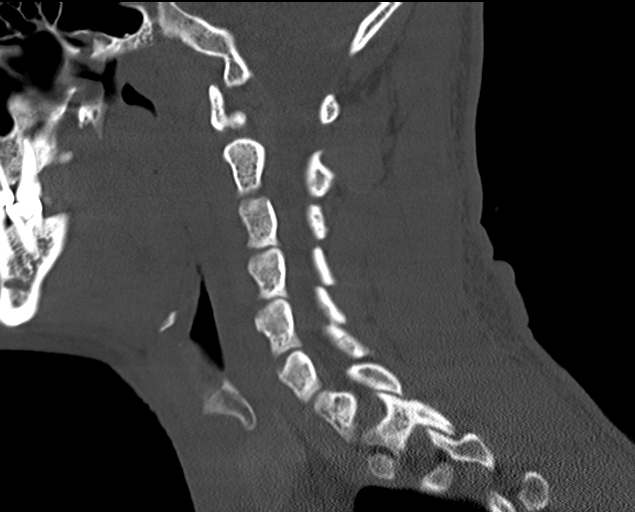
[im 41/61  bone]
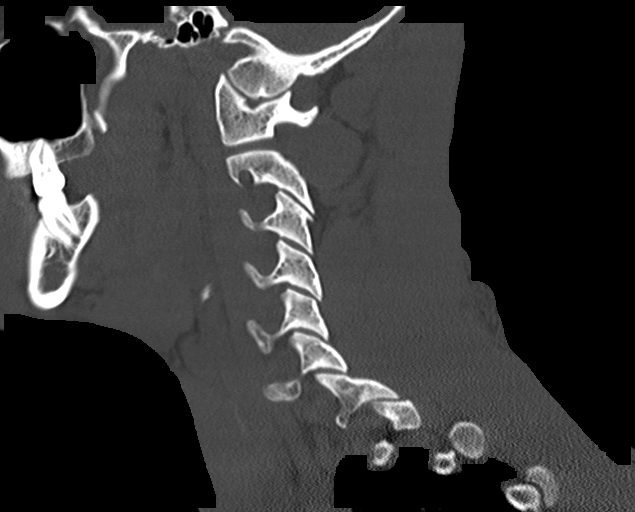

[13 of 35 positions shown; findings below may reference images not displayed]

FINDINGS: CT HEAD FINDINGS

Brain: There is a well-defined area of low attenuation in the
inferior aspect of the left frontal lobe anteriorly, the appearance
of which is most compatible with encephalomalacia, likely related to
remote contusion. Mild contour abnormality of the left lateral
ventricle, stable compared to prior study from 2778, likely
congenital (potentially related to mild periventricular
leukomalacia). No evidence of acute infarction, hemorrhage,
hydrocephalus, extra-axial collection or mass lesion/mass effect.

Vascular: No hyperdense vessel or unexpected calcification.

Skull: Normal. Negative for fracture or focal lesion.

Sinuses/Orbits: No acute finding.

Other: None.

CT CERVICAL SPINE FINDINGS

Alignment: Normal.

Skull base and vertebrae: No acute fracture. No primary bone lesion
or focal pathologic process.

Soft tissues and spinal canal: No prevertebral fluid or swelling. No
visible canal hematoma.

Disc levels: No significant degenerative disc disease or facet
arthropathy.

Upper chest: Negative.

Other: None.
IMPRESSION: 1. No evidence of significant acute traumatic injury to the skull,
brain or cervical spine.
2. There is what appears to be chronic encephalomalacia in the
inferior aspect of the left frontal lobe, presumably related to
remote left frontal lobe contusion.
